# Patient Record
Sex: Female | Born: 1980 | Race: Black or African American | Hispanic: No | Marital: Married
Health system: Southern US, Community
[De-identification: ages and names within clinical notes are randomized; demographics above are authoritative.]

## PROBLEM LIST (undated history)

## (undated) DIAGNOSIS — J45909 Unspecified asthma, uncomplicated: Secondary | ICD-10-CM

---

## 2017-12-23 ENCOUNTER — Emergency Department (HOSPITAL_COMMUNITY)

## 2017-12-23 ENCOUNTER — Emergency Department (HOSPITAL_COMMUNITY)
Admission: EM | Admit: 2017-12-23 | Discharge: 2017-12-23 | Disposition: A | Discharge status: Home / Self Care | Attending: Emergency Medicine | Admitting: Emergency Medicine

## 2017-12-23 ENCOUNTER — Emergency Department (HOSPITAL_BASED_OUTPATIENT_CLINIC_OR_DEPARTMENT_OTHER)
Admit: 2017-12-23 | Discharge: 2017-12-23 | Disposition: A | Discharge status: Home / Self Care | Attending: Emergency Medicine | Admitting: Emergency Medicine

## 2017-12-23 ENCOUNTER — Encounter (HOSPITAL_COMMUNITY): Payer: Self-pay | Admitting: Emergency Medicine

## 2017-12-23 DIAGNOSIS — I82611 Acute embolism and thrombosis of superficial veins of right upper extremity: Secondary | ICD-10-CM | POA: Diagnosis not present

## 2017-12-23 DIAGNOSIS — R2231 Localized swelling, mass and lump, right upper limb: Secondary | ICD-10-CM | POA: Diagnosis present

## 2017-12-23 DIAGNOSIS — R609 Edema, unspecified: Secondary | ICD-10-CM

## 2017-12-23 DIAGNOSIS — J45909 Unspecified asthma, uncomplicated: Secondary | ICD-10-CM | POA: Insufficient documentation

## 2017-12-23 HISTORY — DX: Unspecified asthma, uncomplicated: J45.909

## 2017-12-23 MED ORDER — NAPROXEN 250 MG PO TABS
500.0000 mg | ORAL_TABLET | Freq: Once | ORAL | Status: AC
Start: 2017-12-23 — End: 2017-12-23
  Administered 2017-12-23: 500 mg via ORAL
  Filled 2017-12-23: qty 2

## 2017-12-23 MED ORDER — NAPROXEN 500 MG PO TABS: 500.0000 mg | ORAL_TABLET | Freq: Two times a day (BID) | ORAL | 0 refills | Status: AC

## 2017-12-23 NOTE — ED Notes (Signed)
Pt returned from vascular

## 2017-12-23 NOTE — Discharge Instructions (Addendum)
You were seen in the emergency department today for pain in your right arm.  The ultrasound performed showed that you have a clot in 1 of your superficial veins in this arm.  We suspect this is the cause of your symptoms.  We are starting you on naproxen in order to treat this. Naproxen is a nonsteroidal anti-inflammatory medication that will help with pain and swelling. Be sure to take this medication as prescribed with food, 1 pill every 12 hours,  It should be taken with food, as it can cause stomach upset, and more seriously, stomach bleeding. Do not take other nonsteroidal anti-inflammatory medications with this such as Advil, Motrin, or Aleve.   We have prescribed you new medication(s) today. Discuss the medications prescribed today with your pharmacist as they can have adverse effects and interactions with your other medicines including over the counter and prescribed medications. Seek medical evaluation if you start to experience new or abnormal symptoms after taking one of these medicines, seek care immediately if you start to experience difficulty breathing, feeling of your throat closing, facial swelling, or rash as these could be indications of a more serious allergic reaction.   Additionally please go to a medical supply store and request a compression sleeve in order to help with swelling and discomfort in the right arm. Please wear this until your follow up with your primary care provider.   It is important that you follow-up with your primary care provider in 5 days for reevaluation of your symptoms.  Please return to the ER immediately for any new or worsening symptoms including but not limited to worsened pain, worsened swelling, loss of feeling, trouble breathing, chest pain, or any other concerns.

## 2017-12-23 NOTE — ED Triage Notes (Addendum)
Pt states pain to right arm since yesterday, with pain to hand and elbow, some numbness and tingling. Pt endorses swelling to hand and right forearm. Pt has pain that shoots from her right hand upwards with use of the hand. PT also states yesterday her lips were numb, friend states coldness to right hand yesterday but with good pulses. Pt took a benadryl yesterday and the numbness went away to her lips.

## 2017-12-23 NOTE — Progress Notes (Signed)
VASCULAR LAB PRELIMINARY  PRELIMINARY  PRELIMINARY  PRELIMINARY  Right upper extremity venous duplex completed.    Preliminary report:  There is no DVT noted in the right upper extremity.  There is superficial thrombosis noted in the right cephalic vein from mid forearm to just below the shoulder.   Nathanial Arrighi, RVT 12/23/2017, 2:26 PM

## 2017-12-23 NOTE — ED Provider Notes (Signed)
Patient placed in Quick Look pathway, seen and evaluated   Chief Complaint: arm pain  HPI:   37 year old female presents today with complaints of right upper extremity swelling and pain.  Patient notes days ago she drove up from [facility] approximately 7 hours.  [place] she noticed pain in her right hand with swelling through the upper extremity, she notes very minimal swelling in the hand presently, she notes numbness in the third and fourth fingers.  She denies any other neurological deficits, denies any fever, warmth.  History allergies or abnormal exposures.  No history of DVTs or any significant risk factors  ROS: Arm pain (one)  Physical Exam:   Gen: No distress  Neuro: Awake and Alert  Skin: Warm    Focused Exam: Right upper extremity atraumatic no significant swelling or edema, no redness, no warmth to touch generalized tenderness palpation over the dorsum of the right hand-radial pulse 2+, sensation intact   Initiation of care has begun. The patient has been counseled on the process, plan, and necessity for staying for the completion/evaluation, and the remainder of the medical screening examination   Eyvonne Mechanic, Cordelia Poche 12/23/17 1254    Pricilla Loveless, MD 12/23/17 1313

## 2017-12-23 NOTE — ED Provider Notes (Signed)
[facility] EMERGENCY DEPARTMENT Provider Note   CSN: [id number] Arrival date & time: 12/23/17  1219     History   Chief Complaint Chief Complaint  Patient presents with  . Arm Pain    HPI Karen Noble is a 37 y.o. female without significant past medical hx who presents to the ED with complaints of RUE pain since yesterday morning. Patient states she developed discomfort in the dorsum of her R hand which started to radiate up the RUE to the distal upper arm. She stats she subsequently developed intermittent numbness/tingling to this area as well. She states that the numbness/tingling feeling radiated to her R 4th/5th digits. States that the pain is somewhat of a tightness and worse with certain movements. She states she felt it looked swollen at one point, but is improved today. States she had brief tingling of the lips yesterday as well which resolved- no trouble breathing or feeling of throat swelling associated with this. No new products. No recent injuries/change in activity. No restriction of motion in that arm. No recent infusions/blood draws. She did recently travel to [place]-car ride of 7 hours three days ago. She has a mirena in place. Denies weakness, redness, fever, chills, dyspnea, or chest pain. Patient is R hand dominant. No hx of prior DVT.   HPI  Past Medical History:  Diagnosis Date  . Asthma     There are no active problems to display for this patient.   History reviewed. No pertinent surgical history.   OB History   None      Home Medications    Prior to Admission medications   Not on File    Family History History reviewed. No pertinent family history.  Social History Social History   Tobacco Use  . Smoking status: Not on file  Substance Use Topics  . Alcohol use: Not on file  . Drug use: Not on file     Allergies   Patient has no known allergies.   Review of Systems Review of Systems  Constitutional: Negative  for chills and fever.  Respiratory: Negative for shortness of breath.   Cardiovascular: Negative for chest pain.  Musculoskeletal: Positive for myalgias (RUE).       Positive for RUE swelling  Skin: Negative for color change.  Neurological: Positive for numbness (and paresthesias to RUE). Negative for weakness.  All other systems reviewed and are negative.  Physical Exam Updated Vital Signs BP 139/81 (BP Location: Left Arm)   Pulse 80   Temp 98.1 F (36.7 C) (Oral)   Resp 16   LMP 07/25/2017   SpO2 100%   Physical Exam  Constitutional: She appears well-developed and well-nourished. No distress.  HENT:  Head: Normocephalic and atraumatic.  Eyes: Conjunctivae are normal. Right eye exhibits no discharge. Left eye exhibits no discharge.  Cardiovascular: Normal rate and regular rhythm.  No murmur heard. 2+ symmetric radial pulses  Pulmonary/Chest: Breath sounds normal. No respiratory distress. She has no wheezes. She has no rales.  Abdominal: Soft. She exhibits no distension. There is no tenderness.  Musculoskeletal:  No obvious deformity, appreciable swelling, edema, erythema, ecchymosis, open wounds, or overlying warmth. Upper extremities: Patient has normal range of motion to bilateral shoulders, elbows, wrists, and all digits.  She is somewhat tender to palpation along the distal lateral upper arm and dorsum of the hand. Otherwise nontender. NVI distally.   Neurological: She is alert.  Clear speech.  Sensation intact to sharp/dull touch to bilateral upper extremities.  She has 5 out of 5 symmetric grip strength.  She is able to perform okay sign, thumbs up, and cross second and third digits.  Skin: Skin is warm and dry. Capillary refill takes less than 2 seconds. No rash noted.  Psychiatric: She has a normal mood and affect. Her behavior is normal.  Nursing note and vitals reviewed.  ED Treatments / Results  Labs (all labs ordered are listed, but only abnormal results are  displayed) Labs Reviewed - No data to display  EKG None  Radiology Dg Hand Complete Right  Result Date: 12/23/2017 CLINICAL DATA:  Pain along the fourth and fifth digits and numbness to the hand. EXAM: RIGHT HAND - COMPLETE 3+ VIEW COMPARISON:  None. FINDINGS: There is no evidence of fracture or dislocation. There is no evidence of arthropathy or other focal bone abnormality. Soft tissues are unremarkable. IMPRESSION: Negative. Electronically Signed   By: Ted Mcalpine M.D.   On: 12/23/2017 13:16    Procedures Procedures (including critical care time)  Medications Ordered in ED Medications - No data to display   Initial Impression / Assessment and Plan / ED Course  I have reviewed the triage vital signs and the nursing notes.  Pertinent labs & imaging results that were available during my care of the patient were reviewed by me and considered in my medical decision making (see chart for details).   Patient presents with right upper extremity pain, swelling, and paresthesias.  Patient nontoxic-appearing, in no apparent distress, vitals WNL.  Exam notable for some tenderness to the distal upper arm and the dorsum of the hand.  Patient is neurovascularly intact.  Hand x-ray per triage negative.  Venous duplex of the right upper extremity negative for DVT, there is a superficial thrombosis noted in the right cephalic vein from mid forearm to just below the shoulder, suspect this is etiology of patient's symptoms.  She has no overlying erythema, warmth, or fevers, do not suspect superimposed infectious etiology. Recommended obtaining a compression sleeve. Will place patient on naproxen with close PCP follow-up. I discussed results, treatment plan, need for PCP follow-up, and return precautions with the patient. Provided opportunity for questions, patient confirmed understanding and is in agreement with plan.   Findings and plan of care discussed with supervising physician Dr. Anitra Lauth who  is in agreement with findings and plan of care.   Final Clinical Impressions(s) / ED Diagnoses   Final diagnoses:  Superficial venous thrombosis of right upper extremity    ED Discharge Orders        Ordered    naproxen (NAPROSYN) 500 MG tablet  2 times daily     12/23/17 1638       Arlisha Patalano, Dunstan R, PA-C 12/24/17 0124    Gwyneth Sprout, MD 12/24/17 2246

## 2019-05-29 IMAGING — DX DG HAND COMPLETE 3+V*R*
3 series · 3 of 3 positions shown · non-contrast
Comparison: None.

CLINICAL DATA: Pain along the fourth and fifth digits and numbness
to the hand.

EXAM:
RIGHT HAND - COMPLETE 3+ VIEW

[hand pa]
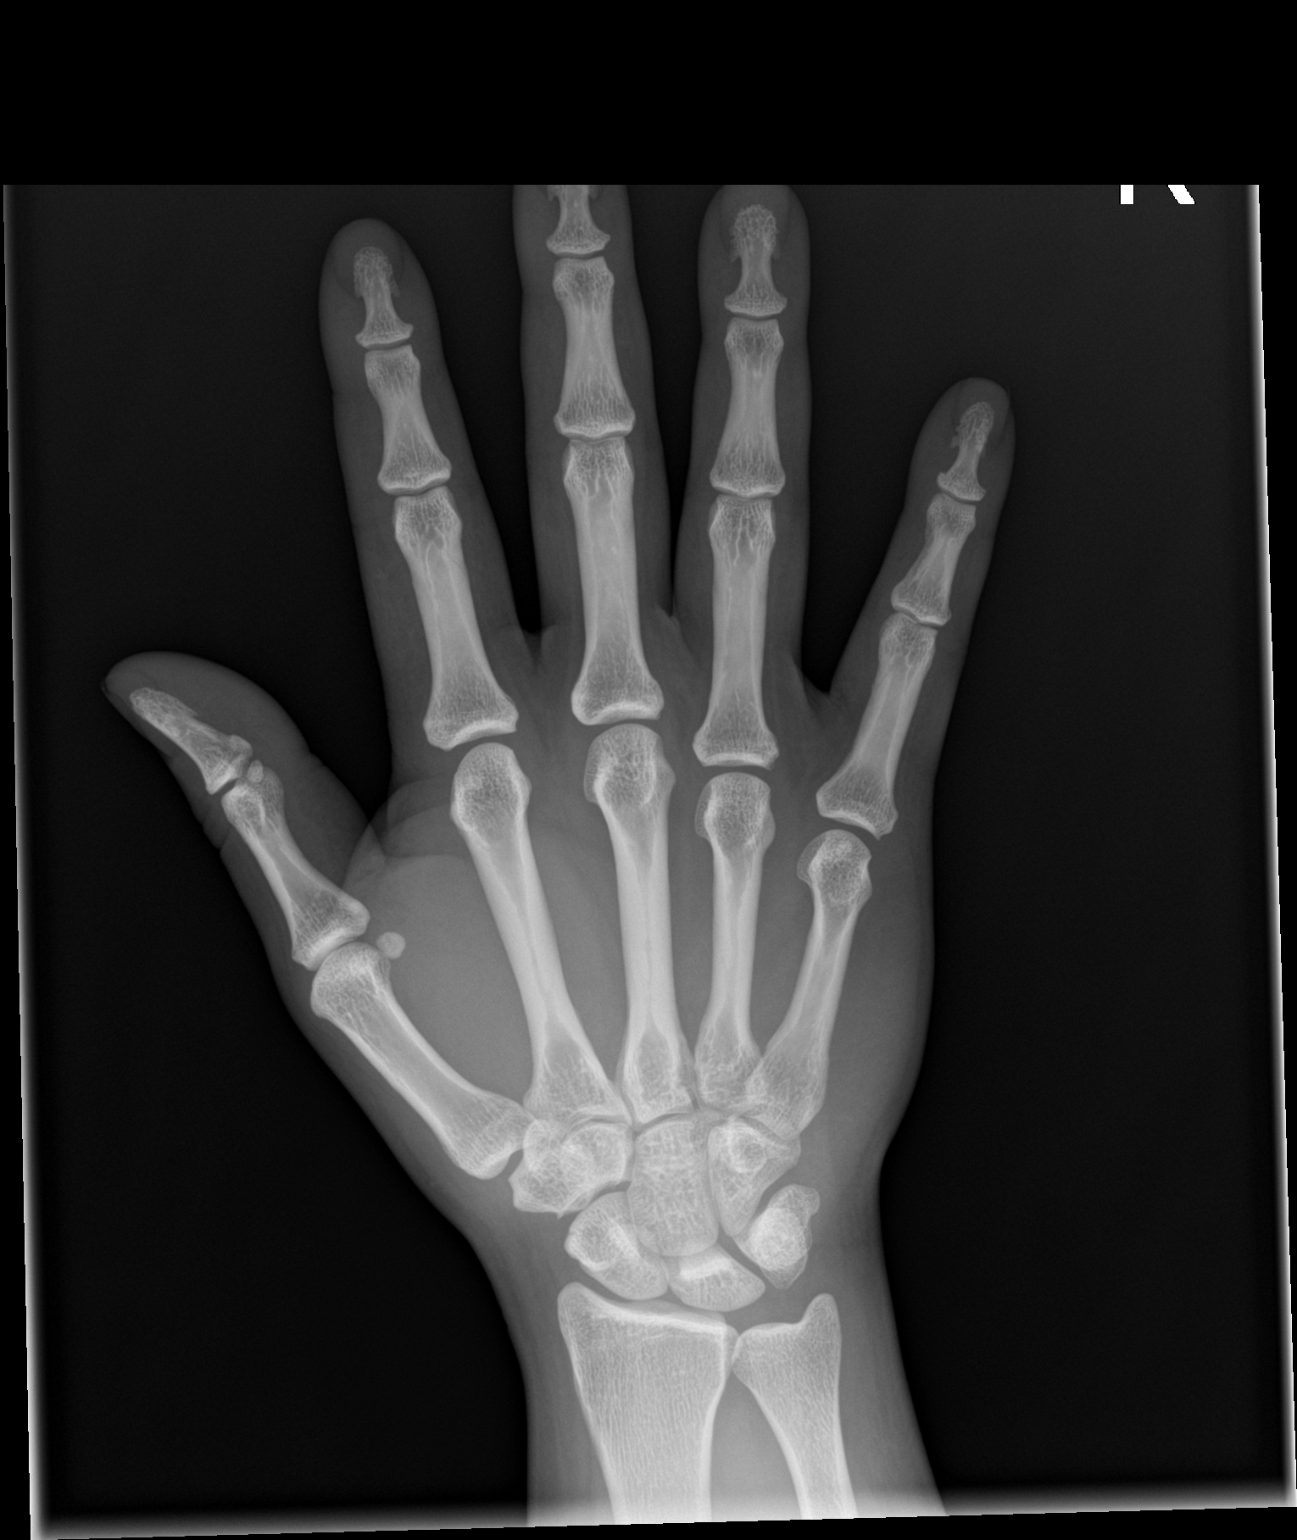

[hand obl]
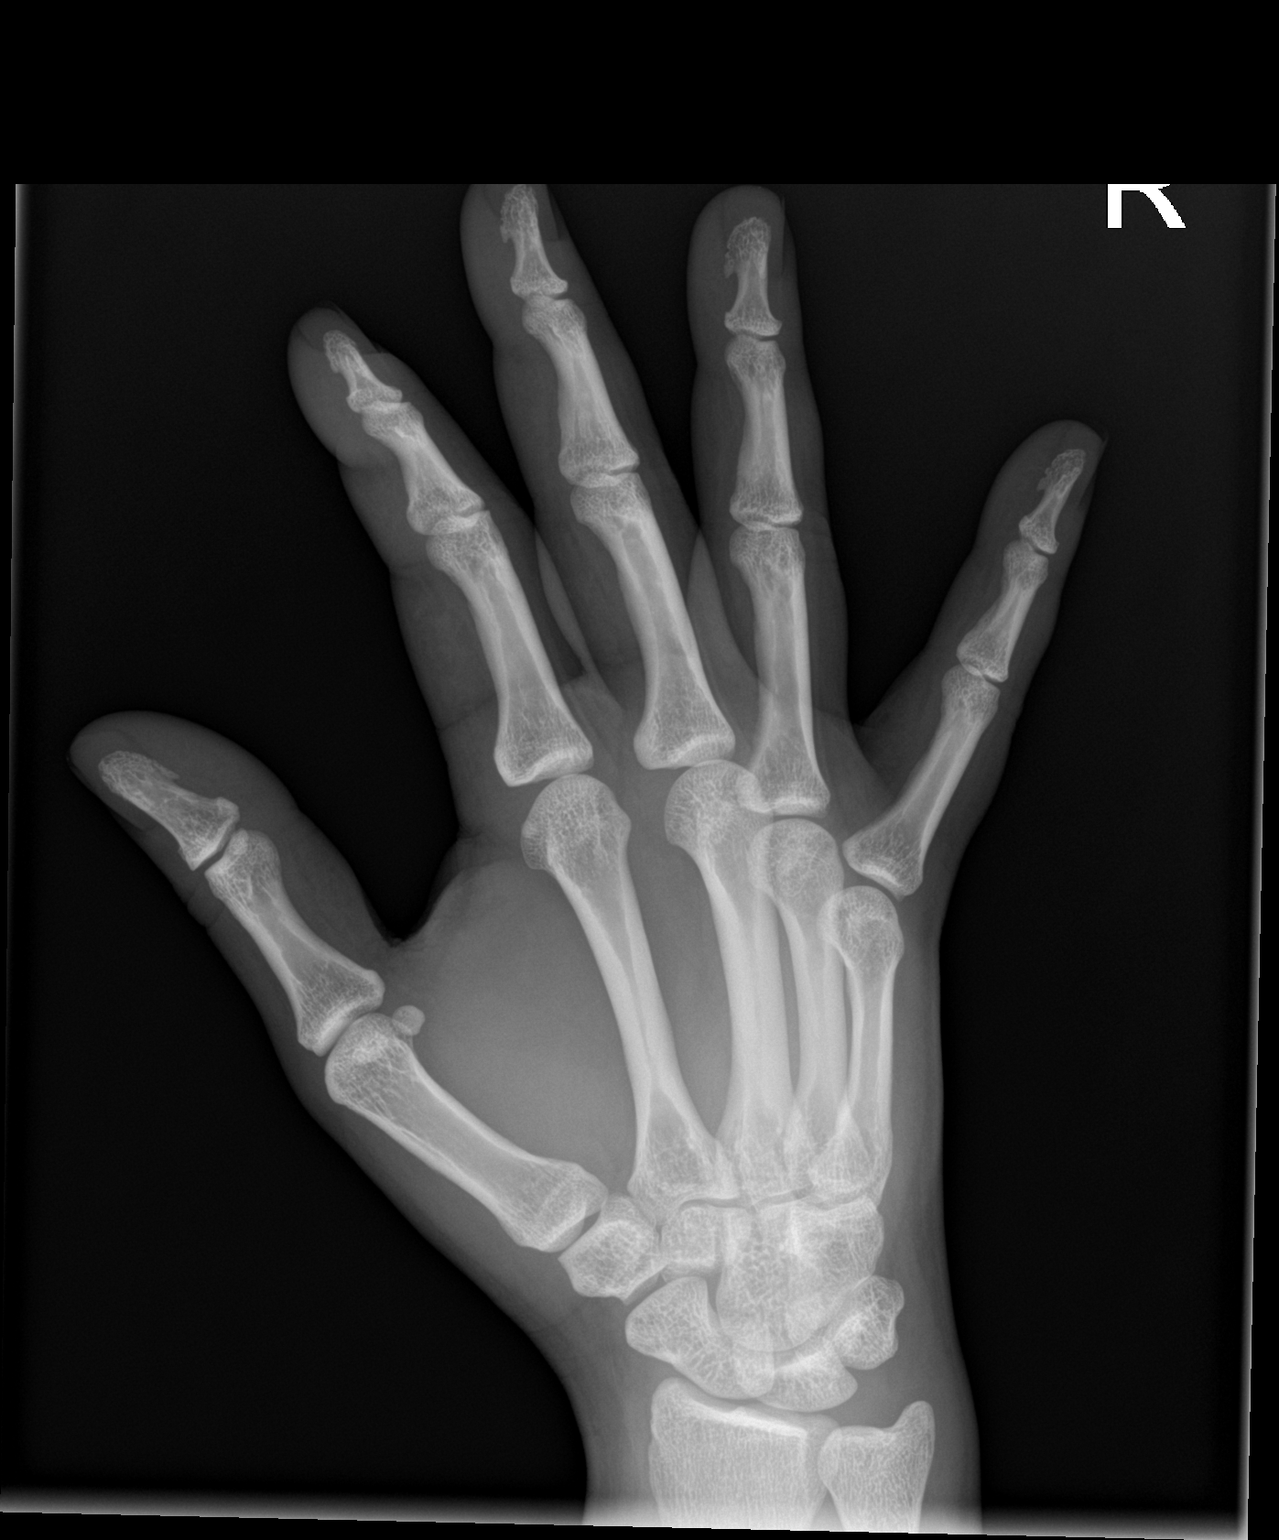

[hand lat]
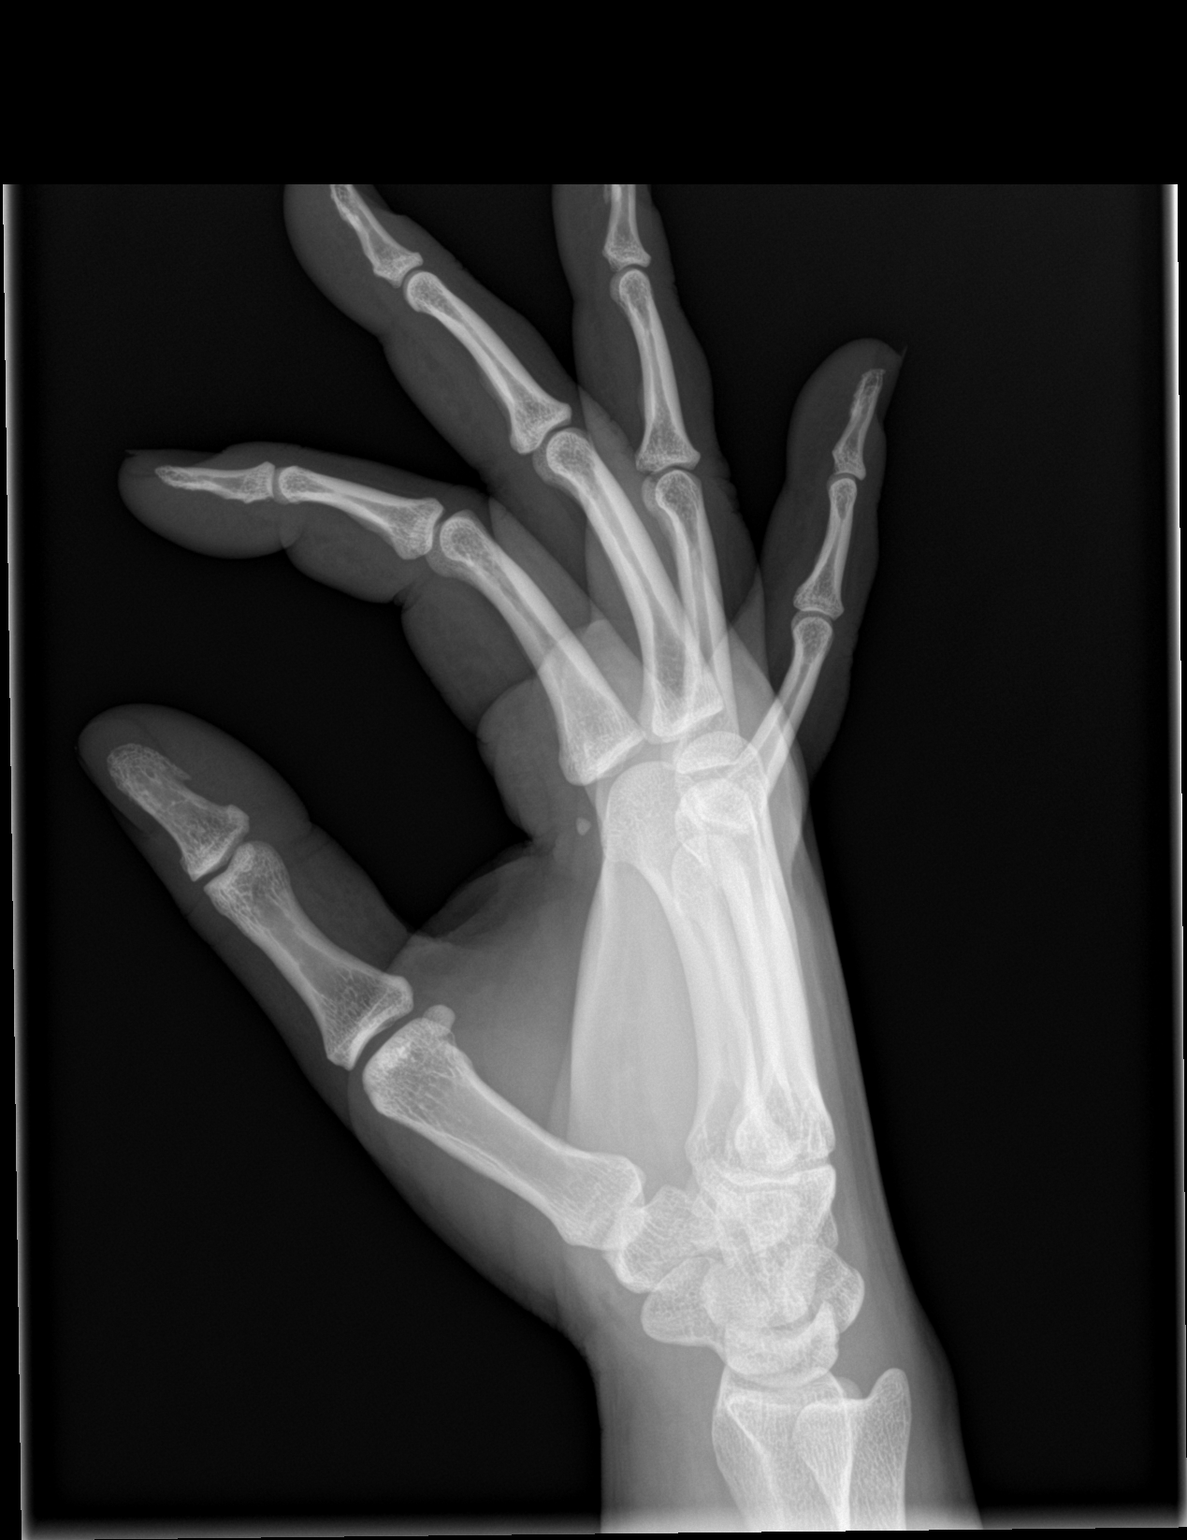

[3 of 3 positions shown; findings below may reference images not displayed]

FINDINGS: There is no evidence of fracture or dislocation. There is no
evidence of arthropathy or other focal bone abnormality. Soft
tissues are unremarkable.
IMPRESSION: Negative.
